# Patient Record
Sex: Female | Born: 1985 | Race: White | Hispanic: No | Marital: Single | Smoking: Never smoker
Health system: Southern US, Community
[De-identification: ages and names within clinical notes are randomized; demographics above are authoritative.]

---
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
## 2008-11-11 ENCOUNTER — Other Ambulatory Visit: Admission: RE | Admit: 2008-11-11 | Discharge: 2008-11-11 | Payer: Self-pay | Admitting: Obstetrics and Gynecology

## 2022-03-22 ENCOUNTER — Encounter (HOSPITAL_BASED_OUTPATIENT_CLINIC_OR_DEPARTMENT_OTHER): Payer: Self-pay

## 2022-03-22 ENCOUNTER — Emergency Department (HOSPITAL_BASED_OUTPATIENT_CLINIC_OR_DEPARTMENT_OTHER): Payer: BLUE CROSS/BLUE SHIELD | Admitting: Radiology

## 2022-03-22 ENCOUNTER — Emergency Department (HOSPITAL_BASED_OUTPATIENT_CLINIC_OR_DEPARTMENT_OTHER)
Admission: EM | Admit: 2022-03-22 | Discharge: 2022-03-23 | Disposition: A | Discharge status: Home / Self Care | Payer: BLUE CROSS/BLUE SHIELD | Attending: Emergency Medicine | Admitting: Emergency Medicine

## 2022-03-22 ENCOUNTER — Other Ambulatory Visit: Payer: Self-pay

## 2022-03-22 DIAGNOSIS — S82831A Other fracture of upper and lower end of right fibula, initial encounter for closed fracture: Secondary | ICD-10-CM

## 2022-03-22 DIAGNOSIS — S89301A Unspecified physeal fracture of lower end of right fibula, initial encounter for closed fracture: Secondary | ICD-10-CM | POA: Insufficient documentation

## 2022-03-22 DIAGNOSIS — S8991XA Unspecified injury of right lower leg, initial encounter: Secondary | ICD-10-CM | POA: Diagnosis present

## 2022-03-22 DIAGNOSIS — W010XXA Fall on same level from slipping, tripping and stumbling without subsequent striking against object, initial encounter: Secondary | ICD-10-CM | POA: Diagnosis not present

## 2022-03-22 MED ORDER — OXYCODONE-ACETAMINOPHEN 5-325 MG PO TABS: 1.0000 | ORAL_TABLET | Freq: Four times a day (QID) | ORAL | 0 refills | Status: AC | PRN

## 2022-03-22 MED ORDER — OXYCODONE-ACETAMINOPHEN 5-325 MG PO TABS
1.0000 | ORAL_TABLET | Freq: Once | ORAL | Status: AC
  Administered 2022-03-22: 1 via ORAL
  Filled 2022-03-22: qty 1

## 2022-03-22 NOTE — ED Provider Notes (Signed)
?[facility] EMERGENCY DEPT ?Provider Note ? ? ?CSN: [id number] ?Arrival date & time: 03/22/22  2109 ? ?  ? ?History ? ?Chief Complaint  ?Patient presents with  ? Ankle Injury  ? ? ?Kathleen Ramsey is a 36 y.o. female. ?She presents the emergency department with right ankle injury.  She says she was going down the stairs this afternoon and slipped and fell down approximately 5 steps.  She twisted her ankle in the process.  She has been nonweightbearing since the incident.  She has had extensive swelling.  Denies any numbness.  Not on any anticoagulation. ? ? ?Ankle Injury ? ? ?  ? ?Home Medications ?Prior to Admission medications   ?Medication Sig Start Date End Date Taking? Authorizing Provider  ?oxyCODONE-acetaminophen (PERCOCET/ROXICET) 5-325 MG tablet Take 1 tablet by mouth every 6 (six) hours as needed for up to 5 days for severe pain. 03/22/22 03/27/22 Yes Zetha Kuhar, Finis Bud, PA-C  ?   ? ?Allergies    ?Reglan [metoclopramide]   ? ?Review of Systems   ?Review of Systems  ?Musculoskeletal:  Positive for arthralgias.  ?All other systems reviewed and are negative. ? ?Physical Exam ?Updated Vital Signs ?BP 113/81 (BP Location: Right Arm)   Pulse 65   Temp 97.9 ?F (36.6 ?C) (Oral)   Resp 15   Ht 5\' 8"  (1.727 m)   Wt 74.8 kg   SpO2 100%   BMI 25.09 kg/m?  ?Physical Exam ?Vitals and nursing note reviewed.  ?Constitutional:   ?   General: She is not in acute distress. ?   Appearance: Normal appearance. She is well-developed. She is not ill-appearing, toxic-appearing or diaphoretic.  ?HENT:  ?   Head: Normocephalic and atraumatic.  ?   Nose: No nasal deformity.  ?   Mouth/Throat:  ?   Lips: Pink. No lesions.  ?Eyes:  ?   General: Gaze aligned appropriately. No scleral icterus.    ?   Right eye: No discharge.     ?   Left eye: No discharge.  ?   Conjunctiva/sclera: Conjunctivae normal.  ?   Right eye: Right conjunctiva is not injected. No exudate or hemorrhage. ?   Left eye: Left conjunctiva is not  injected. No exudate or hemorrhage. ?Pulmonary:  ?   Effort: Pulmonary effort is normal. No respiratory distress.  ?Musculoskeletal:  ?   Right ankle: Swelling present. Normal pulse.  ?   Comments: There is moderate swelling over the lateral aspect of the right ankle.  No ecchymosis noted.  Reduced range of motion.  No step-offs are noted along fibula.  Pedal pulses intact.  Capillary refill intact.  Sensation intact.  ?Skin: ?   General: Skin is warm and dry.  ?Neurological:  ?   Mental Status: She is alert and oriented to person, place, and time.  ?Psychiatric:     ?   Mood and Affect: Mood normal.     ?   Speech: Speech normal.     ?   Behavior: Behavior normal. Behavior is cooperative.  ? ? ?ED Results / Procedures / Treatments   ?Labs ?(all labs ordered are listed, but only abnormal results are displayed) ?Labs Reviewed - No data to display ? ?EKG ?None ? ?Radiology ?DG Ankle Complete Right ? ?Result Date: 03/22/2022 ?CLINICAL DATA:  Fall. EXAM: RIGHT ANKLE - COMPLETE 3+ VIEW COMPARISON:  None. FINDINGS: There is an oblique fracture through the distal fibula at and above the level of the ankle mortise. Fracture fragments are  distracted 3 mm. There is no evidence for dislocation. Joint spaces are maintained. There is lateral soft tissue swelling. IMPRESSION: 1. Acute fracture of the distal fibula with overlying soft tissue swelling. Electronically Signed   By: Darliss Cheney M.D.   On: 03/22/2022 21:38   ? ?Procedures ?Procedures  ? ?Medications Ordered in ED ?Medications  ?oxyCODONE-acetaminophen (PERCOCET/ROXICET) 5-325 MG per tablet 1 tablet (1 tablet Oral Given 03/22/22 2230)  ? ? ?ED Course/ Medical Decision Making/ A&P ?  ?                        ?Medical Decision Making ?Amount and/or Complexity of Data Reviewed ?Radiology: ordered. ? ?Risk ?Prescription drug management. ? ? ? ?MDM  ?This is a 36 y.o. female who presents to the ED with right ankle pain ? ?My Impression, Plan, and ED Course: Patient is  well-appearing.  She has been nonweightbearing on the right ankle.  She has pretty bad swelling of the right lateral ankle.  No step-offs are noted.  She is neurovascularly intact.  X-ray obtained. ? ?I personally ordered, reviewed, and interpreted all laboratory work and imaging and agree with radiologist interpretation. Results interpreted below:  ?XR:  ?1. Acute fracture of the distal fibula with overlying soft tissue  ?swelling.  ? ?It looks like there is an oblique fracture of the distal fibula.  No evidence of any tibia fracture.  We will put her in a posterior splint.  She will be nonweightbearing.  She is from out of town but does have an orthopedic doctor in her hometown.  She will call them tomorrow to set up an appointment for this week and be reevaluated. ? ?Charting Requirements ?Additional history is obtained from:  Independent historian ?External Records from outside source obtained and reviewed including: n/a ?Social Determinants of Health:   From out of town ?Pertinant PMH that complicates patient's illness: n/a ? ?Patient Care ?Problems that were addressed during this visit: ?- Right distal fibula fracture: Acute illness with complication ?Medications given in ED: Percocet ?Reevaluation of the patient after these medicines showed that the patient improved ?I have reviewed home medications and made changes accordingly.  ?Disposition: discharge. F/u with orthopedics ? ?Portions of this note were generated with Scientist, clinical (histocompatibility and immunogenetics). Dictation errors may occur despite best attempts at proofreading. ?  ? ?Final Clinical Impression(s) / ED Diagnoses ?Final diagnoses:  ?Closed fracture of distal end of right fibula, unspecified fracture morphology, initial encounter  ? ? ?Rx / DC Orders ?ED Discharge Orders   ? ?      Ordered  ?  oxyCODONE-acetaminophen (PERCOCET/ROXICET) 5-325 MG tablet  Every 6 hours PRN       ? 03/22/22 2230  ? ?  ?  ? ?  ? ? ?  ?Claudie Leach, PA-C ?03/22/22 2354 ? ?   ?Vanetta Mulders, MD ?03/26/22 [phone] ? ?

## 2022-03-22 NOTE — Discharge Instructions (Addendum)
Please call your orthopedic doctor from back home tomorrow morning to get a follow-up visit set up for a distal fibula fracture.  In the meanwhile, please be nonweightbearing and use crutches as needed.  I prescribed you Percocet for pain if ibuprofen and Tylenol are not helping.  Only take this as prescribed.  This medication can make you sleepy and should not be taken prior to driving. ?

## 2022-03-22 NOTE — ED Triage Notes (Signed)
Patient here POV from Home with Ankle Injury. ? ?Fell at [facility] approximately 1 hour PTA when she slipped and fell approximately 5 steps. Injured her Right Ankle in the Process.  ? ?Swelling at Same Location. No Anticoagulants.  ? ?NAD Noted during Triage. A&Ox4. GCS 15. Ambulatory. ?

## 2023-03-25 IMAGING — DX DG ANKLE COMPLETE 3+V*R*
3 series · 3 of 3 positions shown · non-contrast
Comparison: None.

CLINICAL DATA: Fall.

EXAM:
RIGHT ANKLE - COMPLETE 3+ VIEW

[ankle ap]
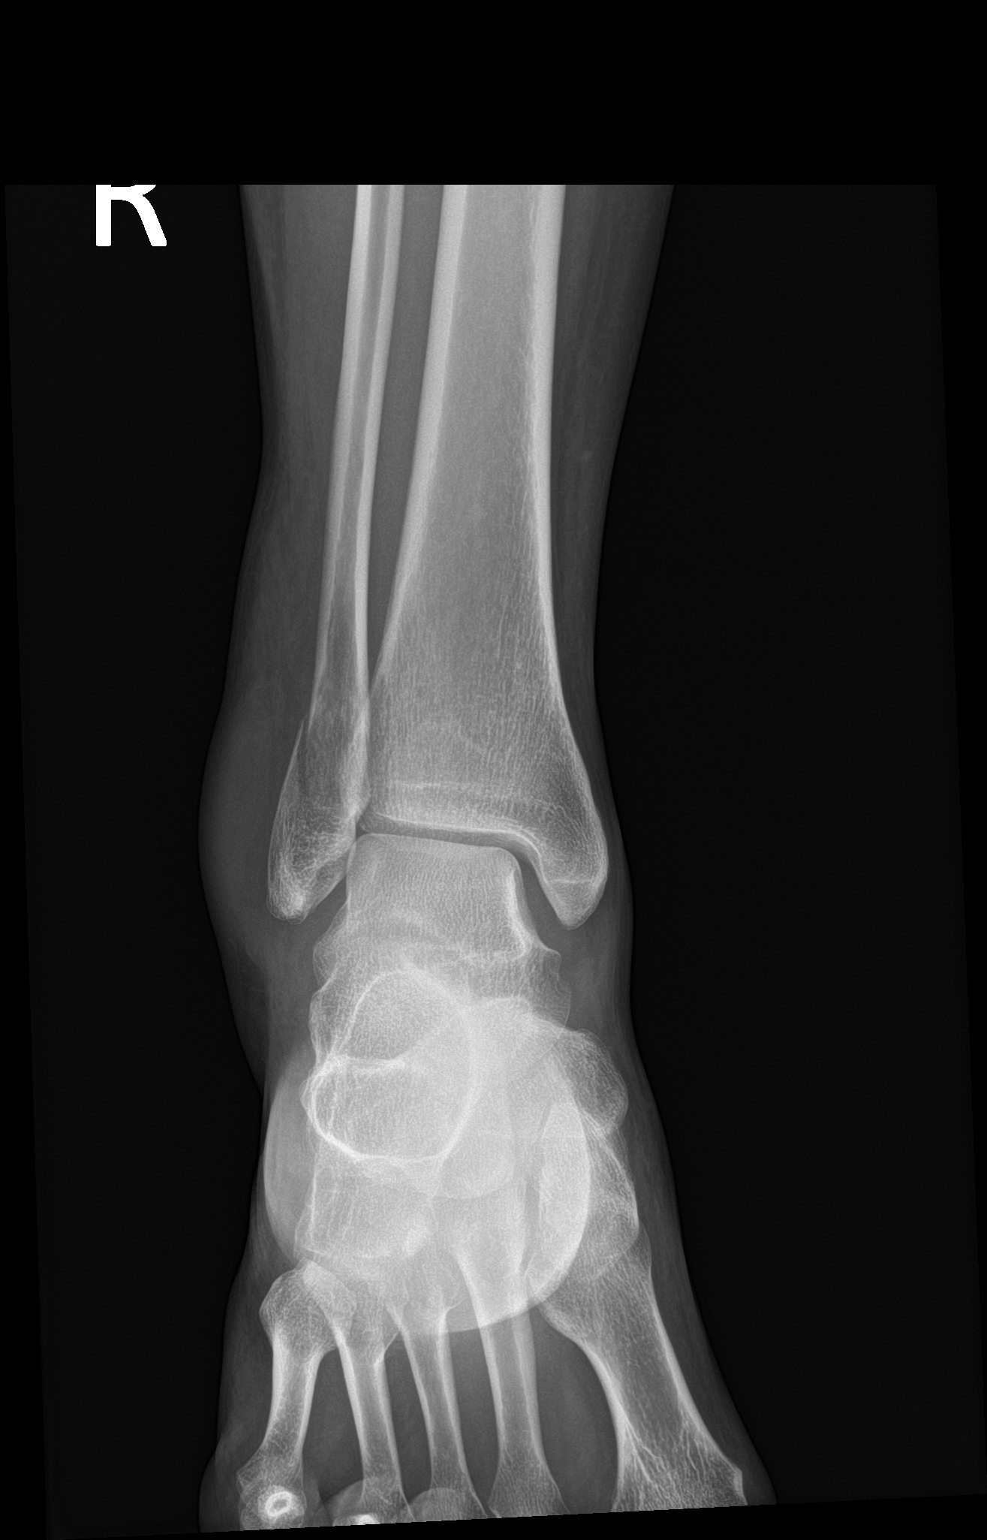

[ankle obl]
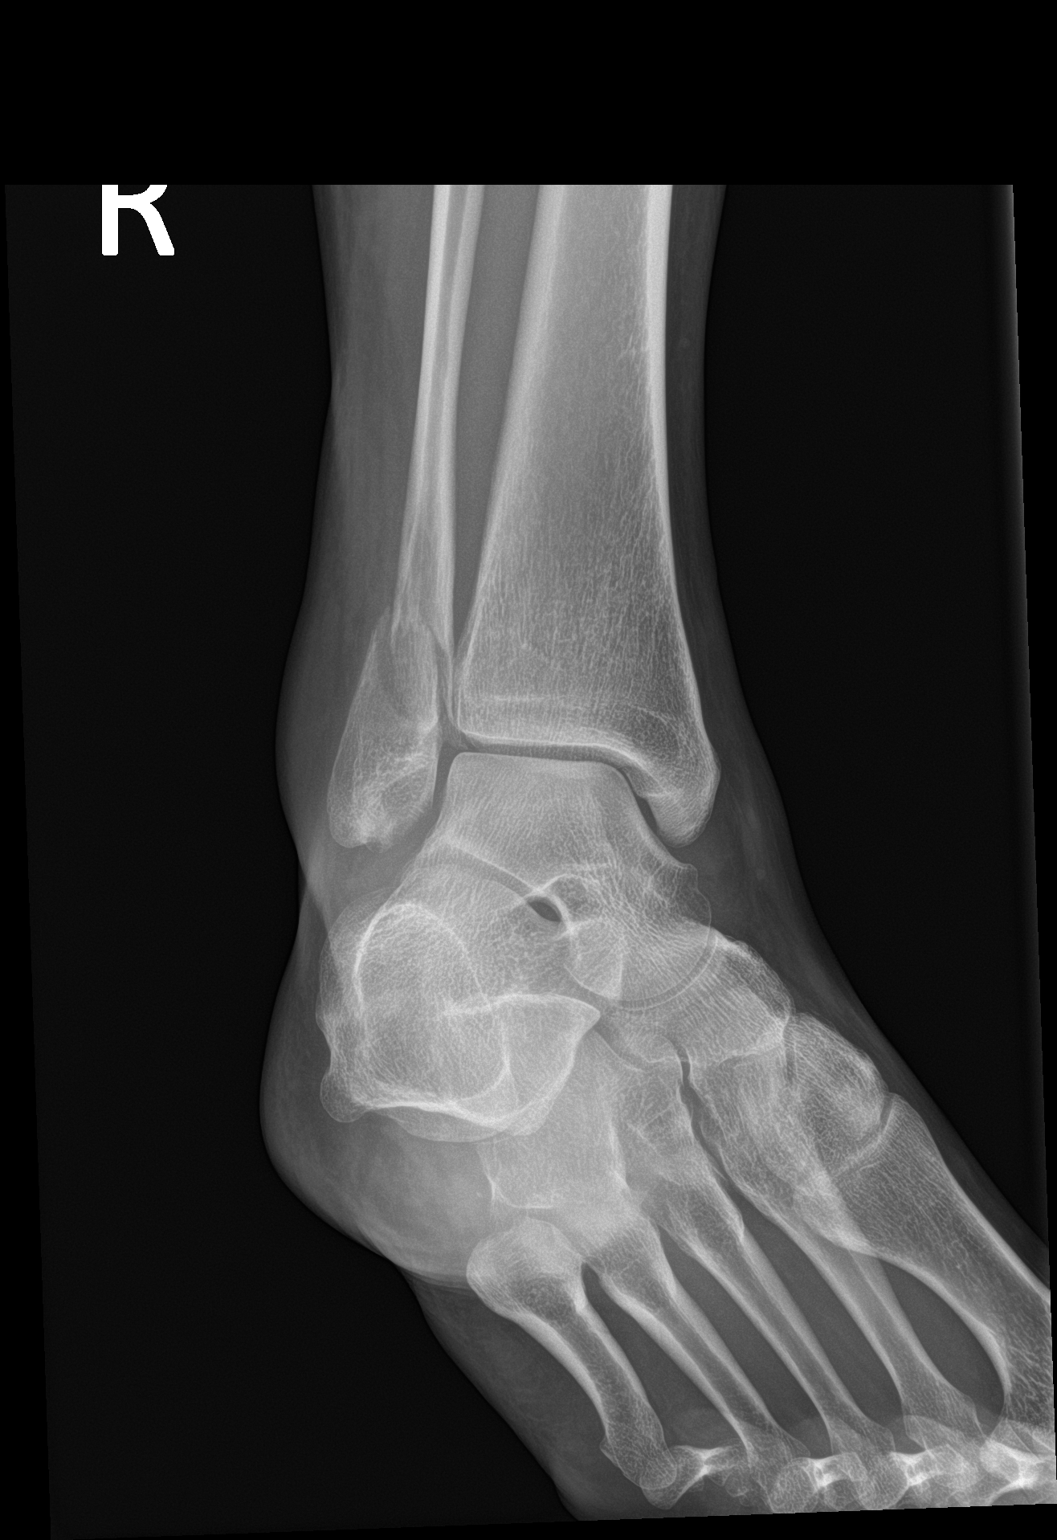

[ankle lat]
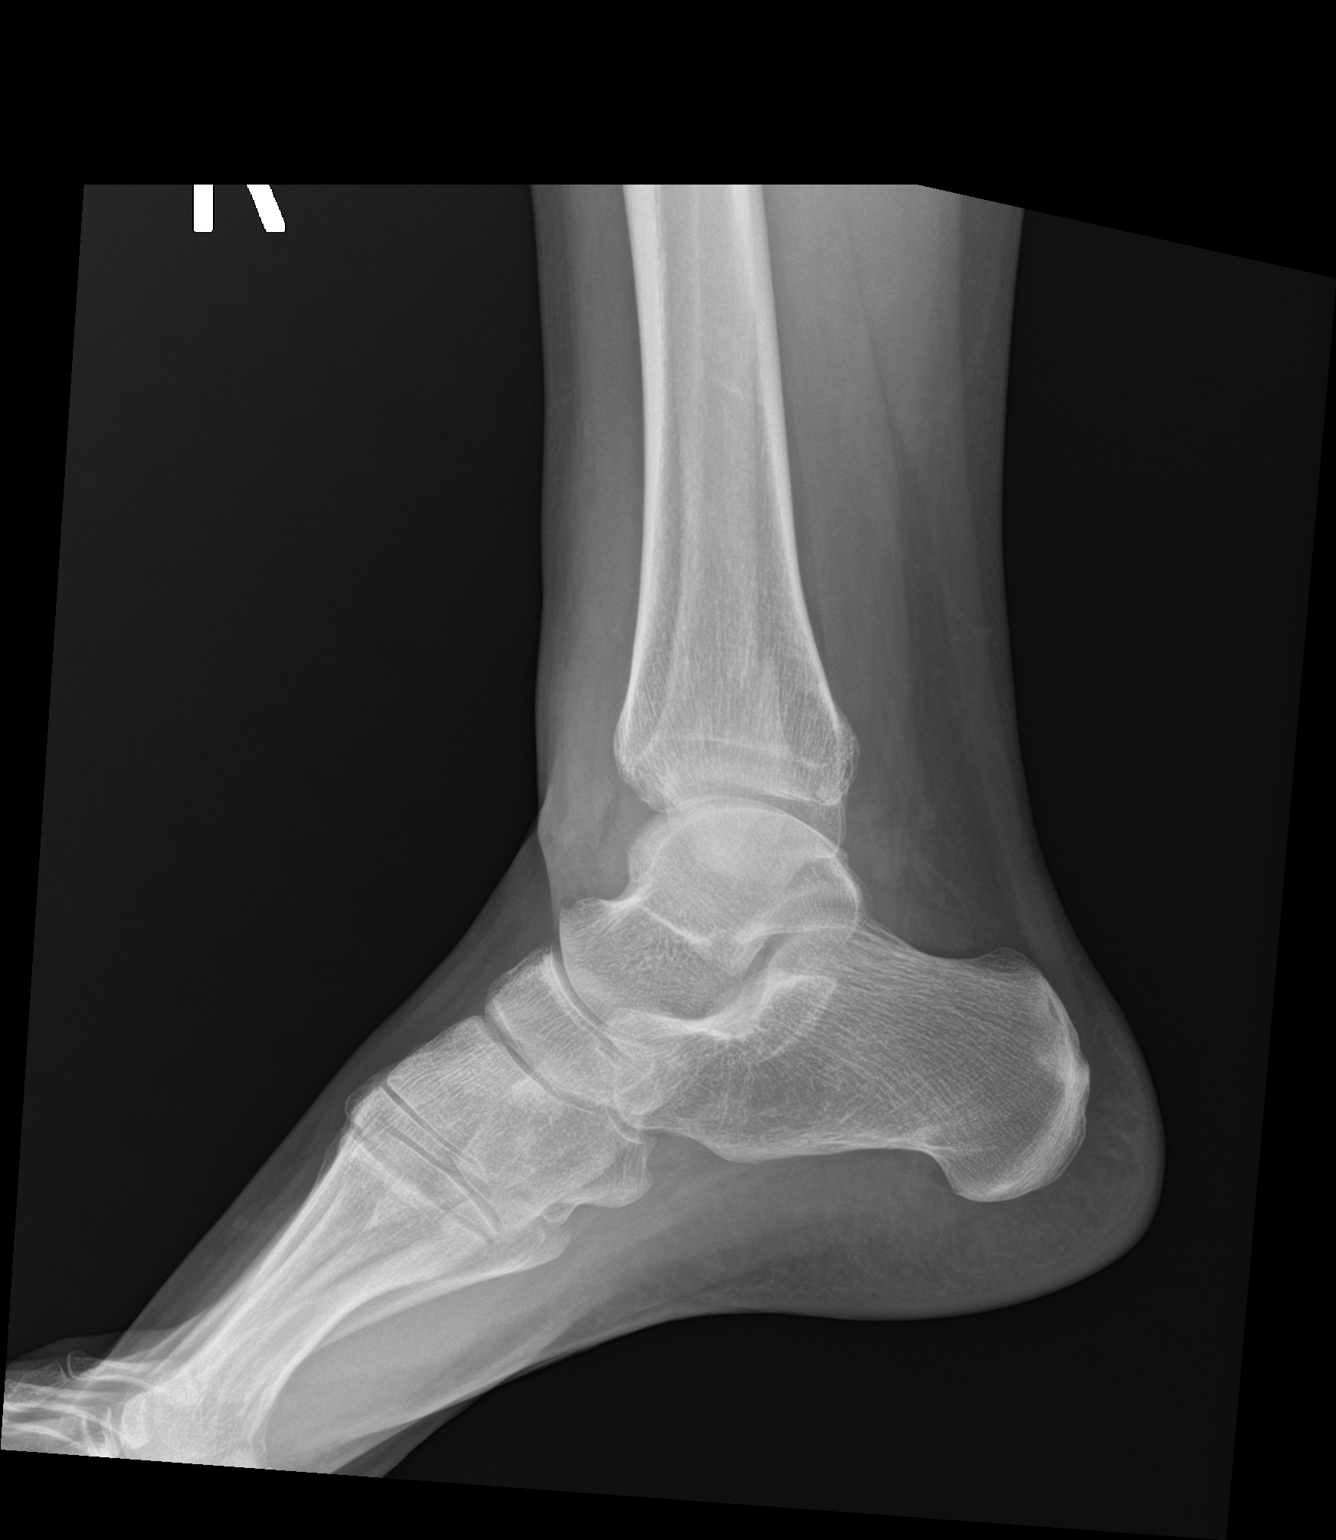

[3 of 3 positions shown; findings below may reference images not displayed]

FINDINGS: There is an oblique fracture through the distal fibula at and above
the level of the ankle mortise. Fracture fragments are distracted 3
mm. There is no evidence for dislocation. Joint spaces are
maintained. There is lateral soft tissue swelling.
IMPRESSION: 1. Acute fracture of the distal fibula with overlying soft tissue
swelling.
